# Patient Record
Sex: Male | Born: 1995
Health system: Southern US, Community
[De-identification: ages and names within clinical notes are randomized; demographics above are authoritative.]

## PROBLEM LIST (undated history)

## (undated) DIAGNOSIS — J45909 Unspecified asthma, uncomplicated: Secondary | ICD-10-CM

## (undated) DIAGNOSIS — F840 Autistic disorder: Secondary | ICD-10-CM

---
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
## 2018-02-07 ENCOUNTER — Ambulatory Visit (INDEPENDENT_AMBULATORY_CARE_PROVIDER_SITE_OTHER): Payer: Medicaid Other | Admitting: Family Medicine

## 2018-02-07 ENCOUNTER — Other Ambulatory Visit: Payer: Self-pay

## 2018-02-07 ENCOUNTER — Encounter: Payer: Self-pay | Admitting: Family Medicine

## 2018-02-07 VITALS — BP 110/64 | HR 77 | Temp 99.0°F | Ht 69.5 in | Wt 144.0 lb

## 2018-02-07 DIAGNOSIS — K5900 Constipation, unspecified: Secondary | ICD-10-CM

## 2018-02-07 DIAGNOSIS — Z0000 Encounter for general adult medical examination without abnormal findings: Secondary | ICD-10-CM

## 2018-02-07 DIAGNOSIS — G479 Sleep disorder, unspecified: Secondary | ICD-10-CM

## 2018-02-07 MED ORDER — POLYETHYLENE GLYCOL 3350 17 GM/SCOOP PO POWD: 17.0000 g | Freq: Two times a day (BID) | ORAL | 1 refills | Status: DC | PRN

## 2018-02-07 NOTE — Progress Notes (Signed)
   Subjective:    Patient ID: Matthew Ingram, male    DOB: 10-Aug-1996, 22 y.o.   MRN: [id number]   CC: physical- new patient  HPI: Patient establishing care with new practice. He is 22 yo still in school with some developmental delays wishing to participate in the special olympics. Has no complaints other than some trouble sleeping/  HA and trouble sleeping: - Dad reports that this is usually when he is laying down at night and gets under the covers with his phone - Plays video games 3-4 hours every night after school - leaves TV on in bedroom - Trouble falling asleep, not staying asleep - Headache occurs when stares at games and phone a lot, and resolves when he closes his eyes to go to bed  Constipation: - Has been an ongoing problem, maybe goes once a week, seems to be hard - mom has tried miralax previously with some good results, but not used long term - tries to encourage fruit and veggies into diet - patient is very active   Review of Systems  Per HPI, else denies recent illness, fever, headache, changes in vision, chest pain, shortness of breath, abdominal pain, N/V/D, weakness   Patient Active Problem List   Diagnosis Date Noted  . Constipation 02/08/2018  . Sleep difficulties 02/08/2018     Objective:  BP 110/64   Pulse 77   Temp 99 F (37.2 C) (Oral)   Ht 5' 9.5" (1.765 m)   Wt 144 lb (65.3 kg)   SpO2 99%   BMI 20.96 kg/m  Vitals and nursing note reviewed  General: NAD, pleasant HEENT: Atreumatic. Normocephalic. Normal TMs and ear canals bilaterally, Normal oropharynx without erythema, lesions, exudate. Fissuring noted on tongue. Neck: No cervical lymphadenopathy.  Cardiac: RRR, no m/r/g Respiratory: CTAB, normal work of breathing Abdomen: soft, nontender, nondistended, bowel sounds normal Skin: warm and dry, no rashes noted MSK: BLUE and BLLE with 5/5 strength, normal gait Neuro: alert and oriented, CN II-XII grossly intact Psych: Normal  affect   Assessment & Plan:    Constipation Patient has had trouble with this for many years and miralax worked previously. Given miralax and instructed on tapering this as needed, based on consistency of bowel movements.   Sleep difficulties Likely related to sleep habit. Counseled on no TV on at night in bedroom and to stop watching 30 minutes prior to bed. Counseled on no cell phone use prior to bed or video games, and also instructed on how to put a red filter on the phone. Encouraged outdoor activities. Encouraged sleep routine nightly.    [place] Corie Allis, DO Family Medicine Resident PGY-1

## 2018-02-07 NOTE — Patient Instructions (Signed)
Thank you for coming to see me today. It was a pleasure! Today we talked about:   Constipation and media usage. Please limit cell phone and TV usage prior to bedtime.  Please us miralax daily to ensure soft bowel movements. May use it as needed.  Please follow-up with me in 1 year or as needed.  If you have any questions or concerns, please do not hesitate to call the office at [phone].  Take Care,   Matthew Elany Felix, DO

## 2018-02-08 DIAGNOSIS — G479 Sleep disorder, unspecified: Secondary | ICD-10-CM | POA: Insufficient documentation

## 2018-02-08 DIAGNOSIS — K5900 Constipation, unspecified: Secondary | ICD-10-CM | POA: Insufficient documentation

## 2018-02-08 NOTE — Assessment & Plan Note (Signed)
Likely related to sleep habit. Counseled on no TV on at night in bedroom and to stop watching 30 minutes prior to bed. Counseled on no cell phone use prior to bed or video games, and also instructed on how to put a red filter on the phone. Encouraged outdoor activities. Encouraged sleep routine nightly.

## 2018-02-08 NOTE — Assessment & Plan Note (Signed)
Patient has had trouble with this for many years and miralax worked previously. Given miralax and instructed on tapering this as needed, based on consistency of bowel movements.

## 2018-02-09 ENCOUNTER — Ambulatory Visit: Payer: Self-pay | Admitting: Family Medicine

## 2018-06-10 ENCOUNTER — Encounter (HOSPITAL_COMMUNITY): Payer: Self-pay

## 2018-06-10 ENCOUNTER — Emergency Department (HOSPITAL_COMMUNITY): Payer: Medicaid Other

## 2018-06-10 ENCOUNTER — Other Ambulatory Visit: Payer: Self-pay

## 2018-06-10 ENCOUNTER — Emergency Department (HOSPITAL_COMMUNITY)
Admission: EM | Admit: 2018-06-10 | Discharge: 2018-06-10 | Disposition: A | Discharge status: Home / Self Care | Payer: Medicaid Other | Attending: Emergency Medicine | Admitting: Emergency Medicine

## 2018-06-10 DIAGNOSIS — R0789 Other chest pain: Secondary | ICD-10-CM | POA: Diagnosis not present

## 2018-06-10 DIAGNOSIS — J45909 Unspecified asthma, uncomplicated: Secondary | ICD-10-CM | POA: Diagnosis not present

## 2018-06-10 DIAGNOSIS — F840 Autistic disorder: Secondary | ICD-10-CM | POA: Insufficient documentation

## 2018-06-10 DIAGNOSIS — R079 Chest pain, unspecified: Secondary | ICD-10-CM | POA: Diagnosis present

## 2018-06-10 LAB — BASIC METABOLIC PANEL
Anion gap: 8 (ref 5–15)
BUN: 9 mg/dL (ref 6–20)
CALCIUM: 9.5 mg/dL (ref 8.9–10.3)
CO2: 25 mmol/L (ref 22–32)
Chloride: 103 mmol/L (ref 101–111)
Creatinine, Ser: 1.04 mg/dL (ref 0.61–1.24)
GFR calc Af Amer: >60 mL/min (ref >60)
GLUCOSE: 98 mg/dL (ref 65–99)
Potassium: 4.9 mmol/L (ref 3.5–5.1)
Sodium: 136 mmol/L (ref 135–145)

## 2018-06-10 LAB — I-STAT TROPONIN, ED
Troponin i, poc: 0 ng/mL (ref 0.00–0.08)
Troponin i, poc: 0 ng/mL (ref 0.00–0.08)

## 2018-06-10 LAB — CBC
HCT: 49.2 % (ref 39.0–52.0)
Hemoglobin: 16.1 g/dL (ref 13.0–17.0)
MCH: 29.2 pg (ref 26.0–34.0)
MCHC: 32.7 g/dL (ref 30.0–36.0)
MCV: 89.1 fL (ref 78.0–100.0)
Platelets: 233 10*3/uL (ref 150–400)
RBC: 5.52 MIL/uL (ref 4.22–5.81)
RDW: 12.2 % (ref 11.5–15.5)
WBC: 4.7 10*3/uL (ref 4.0–10.5)

## 2018-06-10 LAB — CBG MONITORING, ED: Glucose-Capillary: 74 mg/dL (ref 65–99)

## 2018-06-10 LAB — C-REACTIVE PROTEIN: CRP: <0.8 mg/dL (ref <1.0)

## 2018-06-10 LAB — D-DIMER, QUANTITATIVE: D-Dimer, Quant: <0.27 ug/mL-FEU (ref 0.00–0.50)

## 2018-06-10 LAB — SEDIMENTATION RATE: Sed Rate: 1 mm/hr (ref 0–16)

## 2018-06-10 MED ORDER — ACETAMINOPHEN 160 MG/5ML PO SOLN: 500.0000 mg | Freq: Once | ORAL | Status: DC

## 2018-06-10 NOTE — ED Triage Notes (Signed)
Patient complains of 1 week of intermittent left anterior cp. Worse with movement and inspiration, denies cough and cold

## 2018-06-10 NOTE — ED Notes (Signed)
EKG done at triage.

## 2018-06-10 NOTE — ED Notes (Signed)
Transported to radiology via stretcher per rad tech  

## 2018-06-10 NOTE — ED Provider Notes (Signed)
[facility] Provider Note   CSN: [id number] Arrival date & time: 06/10/18  0740     History   Chief Complaint No chief complaint on file.   HPI Matthew Ingram is a 22 y.o. male here for evaluation of chest pain.  Onset 1 week ago.  Onset while patient was walking.  Pain is described as "burning", intermittent, located to the left anterior chest, nonradiating.  Pain lasts anywhere between a few minutes to hours.  Worse with sneezing, coughing, laying flat, taking deep breaths and when he sits forward when getting out of bed.  It feels slightly better when sitting up or leaning forward.  Mother thought it was acid reflux and give him mustard which did not help.  Associated with a mild, dry cough for the last 6 days.  Previous history of benign murmur.  Grandfather had heart failure.  Denies cigarette use.  Denies history of DVT/PE.  No recent travel, prolonged immobilization, surgery, estrogen use, malignancy.  No lower extremity edema or calf  pain.   HPI  Past Medical History:  Diagnosis Date  . Asthma   . Autism     Patient Active Problem List   Diagnosis Date Noted  . Constipation 02/08/2018  . Sleep difficulties 02/08/2018    History reviewed. No pertinent surgical history.      Home Medications    Prior to Admission medications   Medication Sig Start Date End Date Taking? Authorizing Provider  polyethylene glycol powder (GLYCOLAX/MIRALAX) powder Take 17 g by mouth 2 (two) times daily as needed. Patient not taking: Reported on 06/10/2018 02/07/18   Shirley, [place], DO    Family History Family History  Problem Relation Age of Onset  . Cancer Neg Hx   . Diabetes Neg Hx   . Heart failure Neg Hx   . Hyperlipidemia Neg Hx     Social History Social History   Tobacco Use  . Smoking status: Never Smoker  . Smokeless tobacco: Never Used  Substance Use Topics  . Alcohol use: Not on file  . Drug use: Not on file      Allergies   Patient has no known allergies.   Review of Systems Review of Systems  Respiratory: Positive for cough.   Cardiovascular: Positive for chest pain.  All other systems reviewed and are negative.    Physical Exam Updated Vital Signs BP 126/85   Pulse 78   Temp 97.8 F (36.6 C) (Oral)   Resp 18   SpO2 99%   Physical Exam  Constitutional: He appears well-developed and well-nourished.  NAD. Non toxic.   HENT:  Head: Normocephalic and atraumatic.  Nose: Nose normal.  Moist mucous membranes. Tonsils and oropharynx normal  Eyes: Conjunctivae, EOM and lids are normal.  Neck: Trachea normal and normal range of motion.  Trachea midline. No cervical adenopathy  Cardiovascular: Normal rate, regular rhythm, S1 normal, S2 normal and normal heart sounds.  Pulses:      Carotid pulses are 2+ on the right side, and 2+ on the left side.      Radial pulses are 2+ on the right side, and 2+ on the left side.       Dorsalis pedis pulses are 2+ on the right side, and 2+ on the left side.  RRR. No LE edema or calf tenderness.   Pulmonary/Chest: Effort normal and breath sounds normal. No respiratory distress. He has no decreased breath sounds. He has no rhonchi. He exhibits tenderness.  Reproducible chest wall tenderness to left mid chest.  Pain with deep inspiration.    Abdominal: Soft. Bowel sounds are normal. There is no tenderness.  No epigastric tenderness. NTND  Neurological: He is alert. GCS eye subscore is 4. GCS verbal subscore is 5. GCS motor subscore is 6.  Skin: Skin is warm and dry. Capillary refill takes less than 2 seconds.  No rash to chest wall  Psychiatric: He has a normal mood and affect. His speech is normal and behavior is normal. Judgment and thought content normal. Cognition and memory are normal.     ED Treatments / Results  Labs (all labs ordered are listed, but only abnormal results are displayed) Labs Reviewed  BASIC METABOLIC PANEL   SEDIMENTATION RATE  C-REACTIVE PROTEIN  CBC  D-DIMER, QUANTITATIVE (NOT AT [facility])  CBG MONITORING, ED  I-STAT TROPONIN, ED  I-STAT TROPONIN, ED  I-STAT TROPONIN, ED    EKG EKG Interpretation  Date/Time:  Friday June 10 2018 07:46:56 EDT Ventricular Rate:  74 PR Interval:  146 QRS Duration: 90 QT Interval:  364 QTC Calculation: 404 R Axis:   83 Text Interpretation:  Normal sinus rhythm with sinus arrhythmia Nonspecific ST abnormality Abnormal ECG No old tracing to compare Confirmed by Shaune Pollack [phone]) on 06/10/2018 8:22:45 AM   Radiology Dg Chest 2 View  Result Date: 06/10/2018 CLINICAL DATA:  Intermittent left chest pain. EXAM: CHEST - 2 VIEW COMPARISON:  No recent prior. FINDINGS: Mediastinum and hilar structures normal. Lungs are clear. No pleural effusion or pneumothorax. No acute bony abnormality. IMPRESSION: No acute cardiopulmonary disease. Electronically Signed   By: Maisie Fus  Register   On: 06/10/2018 08:47    Procedures Procedures (including critical care time)  Medications Ordered in ED Medications  acetaminophen (TYLENOL) solution 500 mg (has no administration in time range)     Initial Impression / Assessment and Plan / ED Course  I have reviewed the triage vital signs and the nursing notes.  Pertinent labs & imaging results that were available during my care of the patient were reviewed by me and considered in my medical decision making (see chart for details).  Clinical Course as of Jun 11 1219  Fri Jun 10, 2018  1036 Reevaluated patient on work-up benign so far.  He is still having intermittent burning chest pain. Will give NSAID. Pending delta trop.    [CG]    Clinical Course User Index [CG] Liberty Handy, PA-C   22 year old male here with chest pain.   symptoms sound atypical, intermittent, nonexertional.  Pertinent cardiac risk factors include none.  On exam he has reproducible chest wall tenderness with palpation and inspiration.  He has  no risk factors for DVT/PE.  Differential diagnosis includes MSK versus pleurisy versus ACS and PE less likely.  Pericarditis considered as well.  Will obtain chest pain work-up and reassess. HEART score <1.   Final Clinical Impressions(s) / ED Diagnoses   Labs, ekg, cxr, trop 0.00 > 0.00.  Pt's pain was constant in ER with normal trop.  Given low heart score, age, atypical nature of CP likely msk. Will dc with NSAID for next 48 hours, f/u with pcp if persistent pain. Return instructions given, mother in agreement.  Final diagnoses:  Atypical chest pain    ED Discharge Orders    None       Jerrell Mylar 06/10/18 1220    Shaune Pollack, MD 06/11/18 1311

## 2018-06-10 NOTE — ED Notes (Signed)
Attempted IV access x2 without success; blood obtained in process with exception of sed rate; ED phleb will obtain sed rate - pt and mother aware of same

## 2018-06-10 NOTE — ED Notes (Signed)
Onset of left-sided cp x2 week ago - interm - no exacerbating factors - denies recreational drug use/abuse; states has been using mustartd water and tums without relief; presently rates pain 1/10 - appears comfortable at this time; skin warm, dry; reports nonprod cough x6 days - denies fevers; no h/o same pain; no known cardiac hx - family h/o heart murmur (mother); ccm showing SR rate 70 without ectopy; mother at bedside VSS

## 2018-06-10 NOTE — Discharge Instructions (Signed)
You were seen in ER for chest pain.   Your lab work, EKG, chest x-ray, heart enzymes were normal.   The cause of chest pain is unknown, although most likely related to muscular cause.  Possibly acid reflux.   Take acetaminophen (tylenol) or ibuprofen (motrin, advil) every 6-8 hours for the next 2 days.  If pain persists, follow up with your primary care doctor for further evaluation.   Return to the ER if you have worsening pain, shortness of breath, difficulty breathing, fevers, wet sounding cough, leg or calf swelling or pain.

## 2018-06-10 NOTE — ED Notes (Signed)
Pt was discharged by provider who states pt had no questions and understood d/c instructions

## 2018-06-10 NOTE — ED Notes (Signed)
Transported from radiology via stretcher per CT

## 2018-06-10 NOTE — ED Provider Notes (Signed)
Previously healthy 22 year old male here with mild, pleuritic, positional chest pain.  History is consistent with possible muscular skeletal pain versus less likely mild pericarditis or pleuritis.  No history of autoimmune disorders.  No recent medication change.  No recent illnesses, though he did have some sneezing several weeks ago which could represent a mild preceding URI.  He has no evidence of cardiomegaly on chest x-ray to suggest effusion or heart failure.  Lab work pending.  Will plan to treat with anti-inflammatories and good return precautions.   Shaune PollackIsaacs, Densel Kronick, MD 06/10/18 [id number]

## 2018-06-21 ENCOUNTER — Other Ambulatory Visit: Payer: Self-pay

## 2018-06-21 ENCOUNTER — Ambulatory Visit (HOSPITAL_COMMUNITY)
Admission: RE | Admit: 2018-06-21 | Discharge: 2018-06-21 | Disposition: A | Discharge status: Home / Self Care | Payer: Medicaid Other | Source: Ambulatory Visit | Attending: Family Medicine | Admitting: Family Medicine

## 2018-06-21 ENCOUNTER — Ambulatory Visit (INDEPENDENT_AMBULATORY_CARE_PROVIDER_SITE_OTHER): Payer: Medicaid Other | Admitting: Family Medicine

## 2018-06-21 VITALS — BP 100/60 | HR 68 | Temp 98.3°F | Ht 69.5 in | Wt 144.0 lb

## 2018-06-21 DIAGNOSIS — R079 Chest pain, unspecified: Secondary | ICD-10-CM | POA: Diagnosis not present

## 2018-06-21 MED ORDER — IBUPROFEN 100 MG PO CHEW: 600.0000 mg | CHEWABLE_TABLET | Freq: Three times a day (TID) | ORAL | 0 refills | Status: DC | PRN

## 2018-06-21 MED ORDER — CAMPHOR-MENTHOL-METHYL SAL 1.2-5.7-6.3 % EX PTCH: MEDICATED_PATCH | CUTANEOUS | 0 refills | Status: DC

## 2018-06-21 NOTE — Progress Notes (Signed)
   Subjective:    Patient ID: Matthew Ingram, male    DOB: [birth year], 22 y.o.   MRN: [id number]   CC: Chest pain  HPI: Patient is a 22 yo male with a past medical history who present today complaining of chest pain. Patient reports that pain has been going on for 2 weeks (6/10 when he graduated high school).  He describes the pain as sharp, mostly localized to the left side of his chest. Patient has had pain early in the morning and when pushing the lawn mower. Patient has  taken ibuprofen once a day with no change. Patient was seen in the ED for this chest pain and all testing was normal. Patient was told that pain was likely MSK in nature. Patient denies any similar presentation in the past. Patient denies any fever, chills, cough, abdominal pain, reflux, nausea, vomiting and diarrhea.  Smoking status reviewed   ROS: all other systems were reviewed and are negative other than in the HPI   Past Medical History:  Diagnosis Date  . Asthma   . Autism     History reviewed. No pertinent surgical history.  Past medical history, surgical, family, and social history reviewed and updated in the EMR as appropriate.  Objective:  BP 100/60   Pulse 68   Temp 98.3 F (36.8 C) (Oral)   Ht 5' 9.5" (1.765 m)   Wt 144 lb (65.3 kg)   BMI 20.96 kg/m   Vitals and nursing note reviewed  General: NAD, pleasant, able to participate in exam Cardiac: RRR, normal heart sounds, no murmurs. 2+ radial and PT pulses bilaterally Respiratory: CTAB, normal effort, No wheezes, rales or rhonchi Abdomen: soft, nontender, nondistended, no hepatic or splenomegaly, +BS Extremities: no edema or cyanosis. WWP. Skin: warm and dry, no rashes noted Neuro: alert and oriented x4, no focal deficits Psych: Normal affect and mood   Assessment & Plan:    Chest pain Patient presents with chest pain for the past 2 weeks. Pain is sharp and localized to his left chest. It is reproducible on exam with palpation  suggestive of MSK pain. EKG today was NSR. Lung exam was unremarkable. Work up in the ED was also unremarkable with normal CXR, EKG, CRP, ESR, CBC and BMP. Patient does not have any risk factor for ACS based on history and age. No pulmonary symptoms suggestive of infection such as pneumonia or pleuritis. No GI symptoms like GERD which could also explain symptoms. Pain likely MSK in nature. --Ibuprofen 600 mg q8 for 3-4 dayswith food --Salonpas patch  --Return to clinic if symptoms do not improve or worsen      Marjie Skiff, MD [place] PGY-2

## 2018-06-21 NOTE — Patient Instructions (Signed)
Chest Wall Pain °Chest wall pain is pain in or around the bones and muscles of your chest. Sometimes, an injury causes this pain. Sometimes, the cause may not be known. This pain may take several weeks or longer to get better. °Follow these instructions at home: °Pay attention to any changes in your symptoms. Take these actions to help with your pain: °· Rest as told by your health care provider. °· Avoid activities that cause pain. These include any activities that use your chest muscles or your abdominal and side muscles to lift heavy items. °· If directed, apply ice to the painful area: °? Put ice in a plastic bag. °? Place a towel between your skin and the bag. °? Leave the ice on for 20 minutes, 2-3 times per day. °· Take over-the-counter and prescription medicines only as told by your health care provider. °· Do not use tobacco products, including cigarettes, chewing tobacco, and e-cigarettes. If you need help quitting, ask your health care provider. °· Keep all follow-up visits as told by your health care provider. This is important. ° °Contact a health care provider if: °· You have a fever. °· Your chest pain becomes worse. °· You have new symptoms. °Get help right away if: °· You have nausea or vomiting. °· You feel sweaty or light-headed. °· You have a cough with phlegm (sputum) or you cough up blood. °· You develop shortness of breath. °This information is not intended to replace advice given to you by your health care provider. Make sure you discuss any questions you have with your health care provider. °Document Released: 12/14/2005 Document Revised: 04/23/2016 Document Reviewed: 03/11/2015 °Elsevier Interactive Patient Education © 2018 [employer] ° °

## 2018-06-21 NOTE — Assessment & Plan Note (Signed)
Patient presents with chest pain for the past 2 weeks. Pain is sharp and localized to his left chest. It is reproducible on exam with palpation suggestive of MSK pain. EKG today was NSR. Lung exam was unremarkable. Work up in the ED was also unremarkable with normal CXR, EKG, CRP, ESR, CBC and BMP. Patient does not have any risk factor for ACS based on history and age. No pulmonary symptoms suggestive of infection such as pneumonia or pleuritis. No GI symptoms like GERD which could also explain symptoms. Pain likely MSK in nature. --Ibuprofen 600 mg q8 for 3-4 dayswith food --Salonpas patch  --Return to clinic if symptoms do not improve or worsen

## 2018-06-28 ENCOUNTER — Ambulatory Visit (INDEPENDENT_AMBULATORY_CARE_PROVIDER_SITE_OTHER): Payer: Medicaid Other

## 2018-06-28 ENCOUNTER — Other Ambulatory Visit: Payer: Self-pay | Admitting: Family Medicine

## 2018-06-28 DIAGNOSIS — Z111 Encounter for screening for respiratory tuberculosis: Secondary | ICD-10-CM

## 2018-06-28 NOTE — Progress Notes (Signed)
   Patient in to nurse clinic for TB testing. Patient workplace requires quantiferon gold testing. Order placed by preceptor and patient taken to lab.   Ples SpecterAlisa Brake, RN [facility] RN)

## 2018-07-02 LAB — QUANTIFERON-TB GOLD PLUS
QUANTIFERON NIL VALUE: 0.02 [IU]/mL
QUANTIFERON TB1 AG VALUE: 0.02 [IU]/mL
QUANTIFERON TB2 AG VALUE: 0.02 [IU]/mL
QUANTIFERON-TB GOLD PLUS: NEGATIVE

## 2018-07-05 ENCOUNTER — Encounter: Payer: Self-pay | Admitting: Family Medicine

## 2018-07-08 ENCOUNTER — Telehealth: Payer: Self-pay

## 2018-07-08 NOTE — Telephone Encounter (Signed)
Attempted to cal back, but no answer. The result is negative, or normal.  Thanks.

## 2018-07-08 NOTE — Telephone Encounter (Signed)
Pts mom left VM on nurse line asking about 7/2 lab result. Please call mother back with results.

## 2018-07-08 NOTE — Telephone Encounter (Signed)
Mother informed of negative results but will need this in writing for project search.  She is out of town right now but will call when she returns so we can place the results up front for her. [place] Souter,CMA

## 2018-11-29 ENCOUNTER — Other Ambulatory Visit: Payer: Self-pay

## 2018-11-29 ENCOUNTER — Ambulatory Visit (INDEPENDENT_AMBULATORY_CARE_PROVIDER_SITE_OTHER): Payer: Medicaid Other | Admitting: Family Medicine

## 2018-11-29 VITALS — BP 114/78 | HR 68 | Temp 98.5°F | Wt 156.0 lb

## 2018-11-29 DIAGNOSIS — K5900 Constipation, unspecified: Secondary | ICD-10-CM | POA: Diagnosis not present

## 2018-11-29 DIAGNOSIS — F840 Autistic disorder: Secondary | ICD-10-CM | POA: Insufficient documentation

## 2018-11-29 MED ORDER — POLYETHYLENE GLYCOL 3350 17 GM/SCOOP PO POWD: 17.0000 g | Freq: Two times a day (BID) | ORAL | 1 refills | Status: DC | PRN

## 2018-11-29 NOTE — Patient Instructions (Signed)
Thank you for coming to see me today. It was a pleasure!    Try Miralax with each meal until her starts having soft bowel movements and then you may decrease the amount. You may try this with juice to help encourage bowel movements.   Please follow-up as needed.  If you have any questions or concerns, please do not hesitate to call the office at [phone].  Take Care,   [place] Ladasha Schnackenberg, DO  Constipation, Adult Constipation is when a person:  Poops (has a bowel movement) fewer times in a week than normal.  Has a hard time pooping.  Has poop that is dry, hard, or bigger than normal.  Follow these instructions at home: Eating and drinking   Eat foods that have a lot of fiber, such as: ? Fresh fruits and vegetables. ? Whole grains. ? Beans.  Eat less of foods that are high in fat, low in fiber, or overly processed, such as: ? JamaicaFrench fries. ? Hamburgers. ? Cookies. ? Candy. ? Soda.  Drink enough fluid to keep your pee (urine) clear or pale yellow. General instructions  Exercise regularly or as told by your doctor.  Go to the restroom when you feel like you need to poop. Do not hold it in.  Take over-the-counter and prescription medicines only as told by your doctor. These include any fiber supplements.  Do pelvic floor retraining exercises, such as: ? Doing deep breathing while relaxing your lower belly (abdomen). ? Relaxing your pelvic floor while pooping.  Watch your condition for any changes.  Keep all follow-up visits as told by your doctor. This is important. Contact a doctor if:  You have pain that gets worse.  You have a fever.  You have not pooped for 4 days.  You throw up (vomit).  You are not hungry.  You lose weight.  You are bleeding from the anus.  You have thin, pencil-like poop (stool). Get help right away if:  You have a fever, and your symptoms suddenly get worse.  You leak poop or have blood in your poop.  Your belly feels  hard or bigger than normal (is bloated).  You have very bad belly pain.  You feel dizzy or you faint. This information is not intended to replace advice given to you by your health care provider. Make sure you discuss any questions you have with your health care provider. Document Released: 06/01/2008 Document Revised: 07/03/2016 Document Reviewed: 06/03/2016 Elsevier Interactive Patient Education  2018 [employer]

## 2018-11-29 NOTE — Assessment & Plan Note (Signed)
Patient with previous history of constipation.  Counseled on eating high-fiber foods as patient currently has diet mainly of meat and potatoes.  Previously has used MiraLAX which worked however mom has not been using MiraLAX and has only been using milk of magnesia.  Given a prescription for MiraLAX and instructed to taper this as needed based on the consistency of patient's bowel movements. Given handout.

## 2018-11-29 NOTE — Progress Notes (Signed)
  Subjective:    Patient ID: Matthew Ingram, male    DOB: 05/12/1996, 22 y.o.   MRN: [id number]   CC: constipation  HPI: Abdominal pain and constipation Patient reports that his abdominal pain started Saturday in his lower abdomen.  Reports that he had a bowel movement that was very hard and he had to strain with on Monday after an enema and a laxative.  Reports that he thinks that his abdominal pain is due to his constipation.  Laxative that mom uses milk of magnesia.  Patient denies any nausea, vomiting, blood in stool and denies any diarrhea.  Describes the pain as a stabbing in the lower part of his stomach.  Reports that it has improved since his bowel movement on Monday.  Denies any fevers or chills  Smoking status reviewed  ROS: 10 point ROS is otherwise negative, except as mentioned in HPI  Patient Active Problem List   Diagnosis Date Noted  . Autism   . Constipation 02/08/2018  . Sleep difficulties 02/08/2018     Objective:  BP 114/78   Pulse 68   Temp 98.5 F (36.9 C) (Oral)   Wt 156 lb (70.8 kg)   SpO2 96%   BMI 22.71 kg/m  Vitals and nursing note reviewed  General: NAD, pleasant Respiratory: normal effort Abdomen: soft, nontender, nondistended, normoactive bowel sounds x4  Extremities: no edema or cyanosis. WWP. Skin: warm and dry, no rashes noted Neuro: alert and oriented, no focal deficits Psych: normal affect  Assessment & Plan:    Constipation Patient with previous history of constipation.  Counseled on eating high-fiber foods as patient currently has diet mainly of meat and potatoes.  Previously has used MiraLAX which worked however mom has not been using MiraLAX and has only been using milk of magnesia.  Given a prescription for MiraLAX and instructed to taper this as needed based on the consistency of patient's bowel movements. Given handout.     Matthew Katrell Milhorn, DO Family Medicine Resident PGY-2

## 2019-11-30 IMAGING — CR DG CHEST 2V
2 series · 2 of 2 positions shown · non-contrast
Comparison: No recent prior.

CLINICAL DATA: Intermittent left chest pain.

EXAM:
CHEST - 2 VIEW

[chest pa]
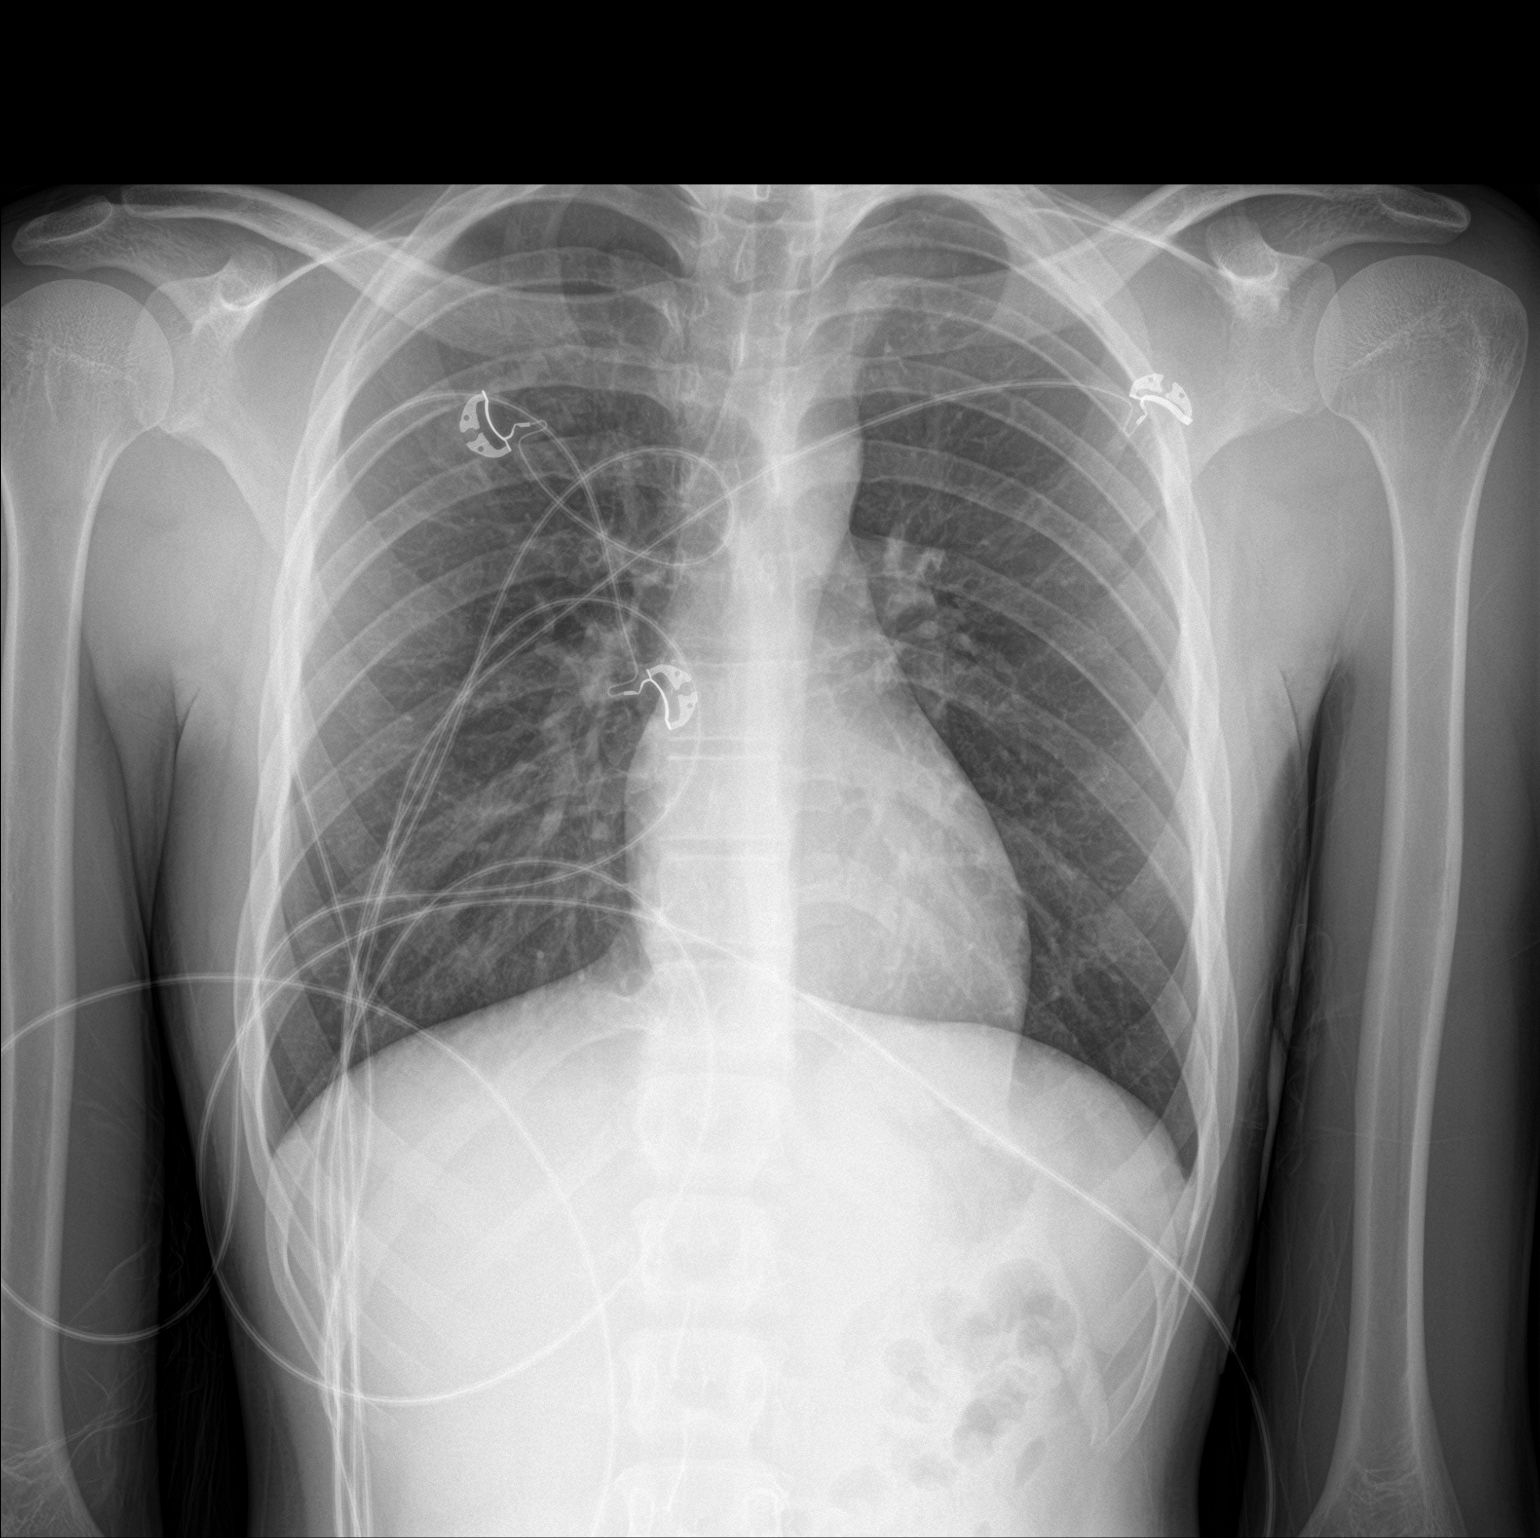

[chest lat]
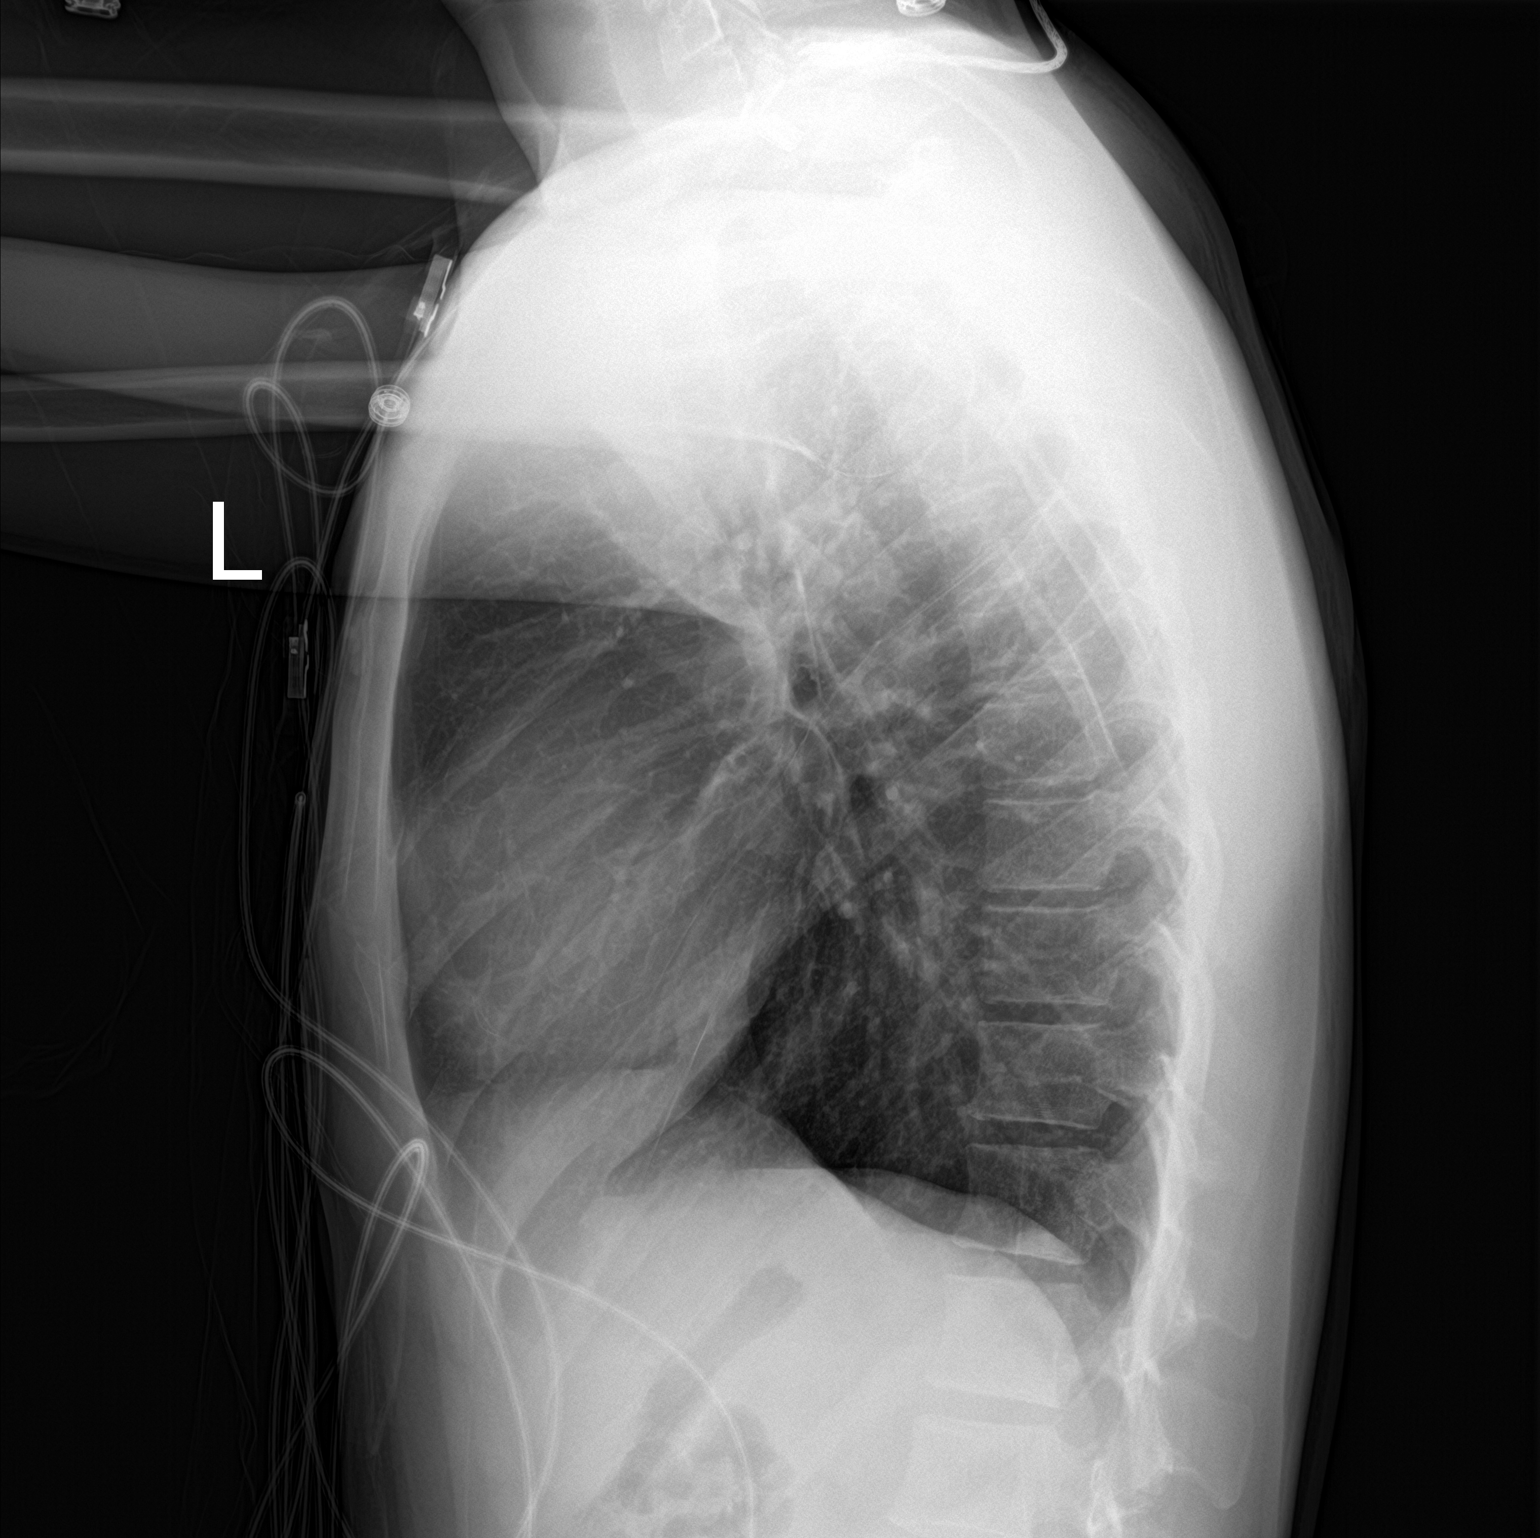

[2 of 2 positions shown; findings below may reference images not displayed]

FINDINGS: Mediastinum and hilar structures normal. Lungs are clear. No pleural
effusion or pneumothorax. No acute bony abnormality.
IMPRESSION: No acute cardiopulmonary disease.

## 2020-02-01 ENCOUNTER — Other Ambulatory Visit: Payer: Self-pay

## 2020-02-01 ENCOUNTER — Ambulatory Visit (INDEPENDENT_AMBULATORY_CARE_PROVIDER_SITE_OTHER): Payer: Medicare Other | Admitting: Internal Medicine

## 2020-02-01 ENCOUNTER — Encounter: Payer: Self-pay | Admitting: Internal Medicine

## 2020-02-01 ENCOUNTER — Telehealth: Payer: Self-pay | Admitting: Radiology

## 2020-02-01 VITALS — BP 118/78 | HR 83 | Ht 69.5 in | Wt 159.2 lb

## 2020-02-01 DIAGNOSIS — R079 Chest pain, unspecified: Secondary | ICD-10-CM | POA: Diagnosis not present

## 2020-02-01 DIAGNOSIS — Z8241 Family history of sudden cardiac death: Secondary | ICD-10-CM | POA: Insufficient documentation

## 2020-02-01 DIAGNOSIS — R002 Palpitations: Secondary | ICD-10-CM | POA: Diagnosis not present

## 2020-02-01 NOTE — Telephone Encounter (Signed)
Enrolled patient for a 14 day Zio monitor to be mailed to patients home.  

## 2020-02-01 NOTE — Patient Instructions (Addendum)
Medication Instructions:  Your physician recommends that you continue on your current medications as directed. Please refer to the Current Medication list given to you today.  Discontinue/reduce your caffeine intake  Labwork: None ordered.  Testing/Procedures: Your physician has requested that you have an echocardiogram. Echocardiography is a painless test that uses sound waves to create images of your heart. It provides your doctor with information about the size and shape of your heart and how well your heart's chambers and valves are working. This procedure takes approximately one hour. There are no restrictions for this procedure.  Please schedule for ECHO  Your physician has recommended that you wear a holter monitor. Holter monitors are medical devices that record the heart's electrical activity. Doctors most often use these monitors to diagnose arrhythmias. Arrhythmias are problems with the speed or rhythm of the heartbeat. The monitor is a small, portable device. You can wear one while you do your normal daily activities. This is usually used to diagnose what is causing palpitations/syncope (passing out).  Follow-Up: Your physician wants you to follow-up in: based on results of tests.   Any Other Special Instructions Will Be Listed Below (If Applicable).  If you need a refill on your cardiac medications before your next appointment, please call your pharmacy.   ZIO XT- Long Term Monitor Instructions   Your physician has requested you wear your ZIO patch monitor__14____days.   This is a single patch monitor.  Irhythm supplies one patch monitor per enrollment.  Additional stickers are not available.   Please do not apply patch if you will be having a Nuclear Stress Test, Echocardiogram, Cardiac CT, MRI, or Chest Xray during the time frame you would be wearing the monitor. The patch cannot be worn during these tests.  You cannot remove and re-apply the ZIO XT patch monitor.   Your  ZIO patch monitor will be sent USPS Priority mail from [facility] directly to your home address. The monitor may also be mailed to a PO BOX if home delivery is not available.   It may take 3-5 days to receive your monitor after you have been enrolled.   Once you have received you monitor, please review enclosed instructions.  Your monitor has already been registered assigning a specific monitor serial # to you.   Applying the monitor   Shave hair from upper left chest.   Hold abrader disc by orange tab.  Rub abrader in 40 strokes over left upper chest as indicated in your monitor instructions.   Clean area with 4 enclosed alcohol pads .  Use all pads to assure are is cleaned thoroughly.  Let dry.   Apply patch as indicated in monitor instructions.  Patch will be place under collarbone on left side of chest with arrow pointing upward.   Rub patch adhesive wings for 2 minutes.Remove white label marked "1".  Remove white label marked "2".  Rub patch adhesive wings for 2 additional minutes.   While looking in a mirror, press and release button in center of patch.  A small green light will flash 3-4 times .  This will be your only indicator the monitor has been turned on.     Do not shower for the first 24 hours.  You may shower after the first 24 hours.   Press button if you feel a symptom. You will hear a small click.  Record Date, Time and Symptom in the Patient Log Book.   When you are ready to remove patch,  follow instructions on last 2 pages of Patient Log Book.  Stick patch monitor onto last page of Patient Log Book.   Place Patient Log Book in [place] box.  Use locking tab on box and tape box closed securely.  The Orange and [employer] has [employer] on it.  Please place in mailbox as soon as possible.  Your physician should have your test results approximately 7 days after the monitor has been mailed back to [facility].   Call [facility] Customer Care at [phone]  if you have questions regarding your ZIO XT patch monitor.  Call them immediately if you see an orange light blinking on your monitor.   If your monitor falls off in less than 4 days contact our Monitor department at [phone].  If your monitor becomes loose or falls off after 4 days call [employer] at [phone] for suggestions on securing your monitor.

## 2020-02-01 NOTE — Progress Notes (Signed)
HPI Matthew Ingram is referred today by Dr. Talbert Forest for evaluation of palpitations and risk assessment for sudden death. His mother is a patient of mine who has malignant ventricular arrhythmias due to phase 2 reentry. Matthew Ingram has not had syncope but does experience palpitations. He makes me a difficult historian. He is fairly vague about his symptoms. He does not have chest pain. He has not worn a cardiac monitor. He has some sob. He has a diagnosis of autism. No Known Allergies   Current Outpatient Medications  Medication Sig Dispense Refill  . Camphor-Menthol-Methyl Sal 1.2-5.7-6.3 % PTCH Apply to affected areas 10 patch 0  . ibuprofen (IBUPROFEN JUNIOR STRENGTH) 100 MG chewable tablet Chew 6 tablets (600 mg total) by mouth every 8 (eight) hours as needed for moderate pain. 30 tablet 0  . polyethylene glycol powder (GLYCOLAX/MIRALAX) powder Take 17 g by mouth 2 (two) times daily as needed. 3350 g 1   No current facility-administered medications for this visit.     Past Medical History:  Diagnosis Date  . Asthma   . Autism     ROS:   All systems reviewed and negative except as noted in the HPI.   History reviewed. No pertinent surgical history.   Family History  Problem Relation Age of Onset  . Cancer Neg Hx   . Diabetes Neg Hx   . Heart failure Neg Hx   . Hyperlipidemia Neg Hx      Social History   Socioeconomic History  . Marital status: Single    Spouse name: Not on file  . Number of children: Not on file  . Years of education: Not on file  . Highest education level: Not on file  Occupational History  . Not on file  Tobacco Use  . Smoking status: Never Smoker  . Smokeless tobacco: Never Used  Substance and Sexual Activity  . Alcohol use: Not on file  . Drug use: Not on file  . Sexual activity: Not on file  Other Topics Concern  . Not on file  Social History Narrative  . Not on file   Social Determinants of Health   Financial Resource Strain:    . Difficulty of Paying Living Expenses: Not on file  Food Insecurity:   . Worried About Programme researcher, broadcasting/film/video in the Last Year: Not on file  . Ran Out of Food in the Last Year: Not on file  Transportation Needs:   . Lack of Transportation (Medical): Not on file  . Lack of Transportation (Non-Medical): Not on file  Physical Activity:   . Days of Exercise per Week: Not on file  . Minutes of Exercise per Session: Not on file  Stress:   . Feeling of Stress : Not on file  Social Connections:   . Frequency of Communication with Friends and Family: Not on file  . Frequency of Social Gatherings with Friends and Family: Not on file  . Attends Religious Services: Not on file  . Active Member of Clubs or Organizations: Not on file  . Attends Banker Meetings: Not on file  . Marital Status: Not on file  Intimate Partner Violence:   . Fear of Current or Ex-Partner: Not on file  . Emotionally Abused: Not on file  . Physically Abused: Not on file  . Sexually Abused: Not on file     BP 118/78   Pulse 83   Ht 5' 9.5" (1.765 m)   Wt 159  lb 3.2 oz (72.2 kg)   SpO2 92%   BMI 23.17 kg/m   Physical Exam:  Well appearing NAD HEENT: Unremarkable Neck:  No JVD, no thyromegally Lymphatics:  No adenopathy Back:  No CVA tenderness Lungs:  Clear HEART:  Regular rate rhythm, 1/6 systolic murmur LLSB, no rubs, no clicks Abd:  soft, positive bowel sounds, no organomegally, no rebound, no guarding Ext:  2 plus pulses, no edema, no cyanosis, no clubbing Skin:  No rashes no nodules Neuro:  CN II through XII intact, motor grossly intact  EKG - nsr with sinus arrhythmia  DEVICE  Normal device function.  See PaceArt for details.   Assess/Plan: 1. Palpitations - these are poorly characterized. I have asked him to obtain a 2 week cardiac monitor. 2. Sob - he will obtain a 2D echo. He has a soft murmur of uncertain significance on exam.  Mikle Bosworth.D.

## 2020-02-06 ENCOUNTER — Ambulatory Visit (INDEPENDENT_AMBULATORY_CARE_PROVIDER_SITE_OTHER): Payer: Medicare Other

## 2020-02-06 DIAGNOSIS — R002 Palpitations: Secondary | ICD-10-CM | POA: Diagnosis not present

## 2020-02-15 ENCOUNTER — Other Ambulatory Visit (HOSPITAL_COMMUNITY): Payer: Medicare Other

## 2020-03-01 ENCOUNTER — Other Ambulatory Visit: Payer: Self-pay

## 2020-03-01 ENCOUNTER — Ambulatory Visit (HOSPITAL_COMMUNITY): Payer: Medicare Other | Attending: Internal Medicine

## 2020-03-01 DIAGNOSIS — R079 Chest pain, unspecified: Secondary | ICD-10-CM | POA: Diagnosis present

## 2020-03-01 DIAGNOSIS — I371 Nonrheumatic pulmonary valve insufficiency: Secondary | ICD-10-CM | POA: Insufficient documentation

## 2020-03-01 DIAGNOSIS — R002 Palpitations: Secondary | ICD-10-CM

## 2020-06-24 ENCOUNTER — Other Ambulatory Visit: Payer: Self-pay | Admitting: Family Medicine

## 2020-06-24 MED ORDER — CETIRIZINE HCL 1 MG/ML PO SOLN: 5.0000 mg | Freq: Every day | ORAL | 11 refills | Status: DC

## 2021-01-21 ENCOUNTER — Other Ambulatory Visit: Payer: Self-pay

## 2021-01-21 ENCOUNTER — Ambulatory Visit (INDEPENDENT_AMBULATORY_CARE_PROVIDER_SITE_OTHER): Payer: Medicare Other | Admitting: Family Medicine

## 2021-01-21 VITALS — BP 98/60 | HR 74

## 2021-01-21 DIAGNOSIS — J069 Acute upper respiratory infection, unspecified: Secondary | ICD-10-CM | POA: Insufficient documentation

## 2021-01-21 DIAGNOSIS — J029 Acute pharyngitis, unspecified: Secondary | ICD-10-CM | POA: Diagnosis not present

## 2021-01-21 LAB — POCT RAPID STREP A (OFFICE): Rapid Strep A Screen: NEGATIVE

## 2021-01-21 NOTE — Patient Instructions (Signed)
I think that you most likely have a viral infection. I think it is very possible you have a Covid infection. We did a strep swab and a Covid swab today in clinic. I will let you know the results of those in the next 2-3 days.  In the meantime, I recommend you take Tylenol for fever and discomfort. You can take up to 4000 mg daily. Also focus on adequate fluid intake.

## 2021-01-21 NOTE — Progress Notes (Signed)
    SUBJECTIVE:   CHIEF COMPLAINT / HPI:   Viral respiratory infection [place] has been feeling unwell for roughly the past week.  He has been having a sore throat, cough and nasal congestion.  He did have a fever up to 102 which improved with Tylenol.  He also notes some muscle aches and pains.  He does not feel like his symptoms have yet started to improve.  His mom notes that he has had multiple episodes of strep throat previously and was suspicious that this might be another episode so brought him in for further testing.  He has not been vaccinated against Covid.  PERTINENT  PMH / PSH: Noncontributory  OBJECTIVE:   BP 98/60   Pulse 74   SpO2 95%   General: Alert and cooperative and appears to be in no acute distress HEENT: Oropharynx normal, no evidence of tonsillar enlargement or tonsillar exudate.  No cervical lymphadenopathy.  TMs visualized bilaterally and normal appearing. Cardio: Normal S1 and S2, no S3 or S4. Rhythm is regular. No murmurs or rubs.   Pulm: Clear to auscultation bilaterally, no crackles, wheezing, or diminished breath sounds. Normal respiratory effort Abdomen: Bowel sounds normal. Abdomen soft and non-tender.  Extremities: No peripheral edema. Warm/ well perfused.  Strong radial pulses. Neuro: Cranial nerves grossly intact  ASSESSMENT/PLAN:   Viral upper respiratory tract infection with cough Based on the presence of the cough and lack of cervical lymphadenopathy, streptococcal pharyngitis was unlikely although rapid test was performed based on parental request.  Rapid strep was negative.  I have a low suspicion for strep throat and will not send a culture.  He and his mother were informed that this sounds more like a viral respiratory infection more similar to coronavirus or influenza.  Were not currently testing for influenza in our clinic.  We will send out a Covid swab.  He does not have any risk factors for severe disease.     Mirian Mo, MD [facility]  [facility]

## 2021-01-21 NOTE — Assessment & Plan Note (Signed)
Based on the presence of the cough and lack of cervical lymphadenopathy, streptococcal pharyngitis was unlikely although rapid test was performed based on parental request.  Rapid strep was negative.  I have a low suspicion for strep throat and will not send a culture.  He and his mother were informed that this sounds more like a viral respiratory infection more similar to coronavirus or influenza.  Were not currently testing for influenza in our clinic.  We will send out a Covid swab.  He does not have any risk factors for severe disease.

## 2021-01-23 LAB — SARS-COV-2, NAA 2 DAY TAT

## 2021-01-23 LAB — NOVEL CORONAVIRUS, NAA: SARS-CoV-2, NAA: NOT DETECTED

## 2021-08-14 ENCOUNTER — Encounter: Payer: Self-pay | Admitting: Student

## 2021-08-14 ENCOUNTER — Other Ambulatory Visit: Payer: Self-pay

## 2021-08-14 ENCOUNTER — Ambulatory Visit (INDEPENDENT_AMBULATORY_CARE_PROVIDER_SITE_OTHER): Payer: Medicare HMO | Admitting: Student

## 2021-08-14 VITALS — BP 133/86 | HR 89 | Wt 207.8 lb

## 2021-08-14 DIAGNOSIS — W57XXXA Bitten or stung by nonvenomous insect and other nonvenomous arthropods, initial encounter: Secondary | ICD-10-CM

## 2021-08-14 DIAGNOSIS — Z9189 Other specified personal risk factors, not elsewhere classified: Secondary | ICD-10-CM | POA: Diagnosis not present

## 2021-08-14 MED ORDER — DIPHENHYDRAMINE-ZINC ACETATE 2-0.1 % EX CREA: 1.0000 "application " | TOPICAL_CREAM | Freq: Three times a day (TID) | CUTANEOUS | 0 refills | Status: DC | PRN
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
## 2021-08-14 NOTE — Patient Instructions (Addendum)
Mr. Montray,  It was wonderful to see you today. Thank you for allowing me to be a part of your care. Below is a short summary of what we discussed at your visit today:  We discussed your feeling of frustration and thoughts about self harm triggered by people stressing you out.  Therapy has helped in the past and unfortunately you've been having difficulty scheduling which her previous therapist.  I have provided you with a list of therapist below that says patients who do not have insurance.  We will schedule another appointment to address your nausea and vomiting   If you have any questions or concerns, please do not hesitate to contact us via phone or MyChart message.   Jerre Simon, MD [facility]     If you are feeling suicidal or depression symptoms worsen please immediately go to:   If you are thinking about harming yourself or having thoughts of suicide, or if you know someone who is, seek help right away. If you are in crisis, make sure you are not left alone.  If someone else is in crisis, make sure he/she/they is not left alone  Call 988 OR 1-800-273-TALK  24 Hour Availability for Walk-IN services  [facility]  [address] [place], [place] [facility] [place] [phone] Crisis [phone]    Other crisis resources:  Family Service of the [employer] (Domestic Violence, Rape & Victim Assistance [phone]  [facility] [employer] [facility]    (ONLY from 8am-4pm)    4127120104  [facility] Mobile Crisis Unit (24/7)   [phone]  [place] National Suicide Hotline   [phone] Len Childs)       Outpatient Mental Health Providers (No Insurance required or Self Pay)  [facility]  [phone] [email or web address] Marcy Panning and [facility]  [address] [place], [place]  [phone] Crisis [phone]  [facility]) can see uninsured folks for outpatient therapy [email or web address] [address] [place], [place] [address] [phone]  [facility]    Walk-in Mon-Fri, 8am-3pm [email or web address] Scull [address] [place], [place]  [phone]   [address]  [place] [phone] [facility] [place] [facility] for psych med management, there may be a wait- if [facility] is working with clients for OPT, they will coordinate with [facility] for psych  [facility]   Walk-in-Clinic: Monday- Friday 9:00 AM - 4:00 PM [address]   [place], [place] [phone]  Family Services of the [place] [employer]) walk in M-F 8am-12pm and  1pm-3pm [place]- [address]     [phone]  [employer] -[address]  Phone: [phone]  [employer] (Mental Health and substance challenges) [address]   [place] [phone]    [email or web address]    [address]  [employer] [facility] the [place]  [facility] -[address], [place]     Phone:  [phone] [place]-  [address]  [phone]   [facility]  [address] [place]  [phone] [email or web address]   [facility] ( virtual or zoom therapy) [email or web address]  [address] [place]  [phone]    [facility] [phone]  grief counseling, dementia and caregiver support    [facility] Walk-in MWF 12:30 to 3:00     [address] [place]  [place] [address]  [phone]  [email or web address] call to schedule an appointment     National Alliance on Mental Illness (NAMI) [place]- Wellness classes, Support groups        [address] [place], [place] [address] [phone]   [email or web address]   [facility]  (Psycho-social Rehabilitation clubhouse, Individual and group therapy) [address] [place], [place] [address]   336- [phone]  24- Hour Availability:  [facility] [phone] or [phone] * Family Service of the [employer] (Domestic Violence, Rape, etc. )([phone] Vesta Mixer [phone] or 5070935831 * [facility] [place] [facility] [phone] only) [phone] (after hours) *[facility] Crisis Unit [phone] *[place] National Suicide Hotline [phone] Len Childs)

## 2021-09-17 ENCOUNTER — Ambulatory Visit: Payer: Medicare HMO | Admitting: Family Medicine

## 2021-09-17 NOTE — Progress Notes (Deleted)
   SUBJECTIVE:   CHIEF COMPLAINT / HPI:   No chief complaint on file.    Matthew Ingram is a 24 y.o. male here for ***   Pt reports ***    PERTINENT  PMH / PSH: reviewed and updated as appropriate   OBJECTIVE:   There were no vitals taken for this visit.  ***  ASSESSMENT/PLAN:   No problem-specific Assessment & Plan notes found for this encounter.     Katha Cabal, DO PGY-3, [facility] 09/17/2021      {    This will disappear when note is signed, click to select method of visit    :1}

## 2022-03-11 DIAGNOSIS — H52203 Unspecified astigmatism, bilateral: Secondary | ICD-10-CM | POA: Diagnosis not present

## 2022-03-11 DIAGNOSIS — H5213 Myopia, bilateral: Secondary | ICD-10-CM | POA: Diagnosis not present

## 2022-03-11 DIAGNOSIS — Z0100 Encounter for examination of eyes and vision without abnormal findings: Secondary | ICD-10-CM | POA: Diagnosis not present

## 2022-12-15 ENCOUNTER — Encounter: Payer: Medicare HMO | Admitting: Student

## 2022-12-30 ENCOUNTER — Ambulatory Visit (INDEPENDENT_AMBULATORY_CARE_PROVIDER_SITE_OTHER): Payer: Medicare HMO | Admitting: Student

## 2022-12-30 ENCOUNTER — Encounter: Payer: Self-pay | Admitting: Student

## 2022-12-30 VITALS — BP 125/72 | HR 97 | Ht 69.5 in | Wt 213.6 lb

## 2022-12-30 DIAGNOSIS — R69 Illness, unspecified: Secondary | ICD-10-CM | POA: Diagnosis not present

## 2022-12-30 DIAGNOSIS — Z13220 Encounter for screening for lipoid disorders: Secondary | ICD-10-CM | POA: Diagnosis not present

## 2022-12-30 DIAGNOSIS — M7989 Other specified soft tissue disorders: Secondary | ICD-10-CM | POA: Insufficient documentation

## 2022-12-30 DIAGNOSIS — R4589 Other symptoms and signs involving emotional state: Secondary | ICD-10-CM

## 2022-12-30 NOTE — Assessment & Plan Note (Signed)
XR right hand and forearm for evaluation given length of time swelling has occurred.  Patient is not at increased risk for any type of DVT.  Currently, hemodynamically stable on room air and without any swelling of the right hand or upper extremity.  After evaluation of x-rays for concern of trauma, instructed to continue with monitoring.  If he notices the swelling again, he is to take a photo of this so I can see it.  Strict return precautions were given.

## 2022-12-30 NOTE — Assessment & Plan Note (Signed)
Decided to continue with therapy as a starting point  Therapist options based on patients insurance provided  Suicide Hotline provided  Patient without SI/HI; denies active plan and reports that they are safe to continue with outpatient treatment.  Close follow up 2 months

## 2022-12-30 NOTE — Patient Instructions (Addendum)
It was great to see you today! Thank you for choosing [facility] for your primary care. Matthew Ingram was seen for physical exam.  Today we addressed:  Therapy and Counseling Resources Most providers on this list will take Medicaid. Patients with commercial insurance or Medicare should contact their insurance company to get a list of in network providers.  [facility] (spanish speaking therapist available)(habla espanol)(take medicare and medicaid)  [place], [place], [place] [address], [place] [email or web address] [phone]  [facility]iatry and Counseling [address] [place], [place] [address] [place], [place], [place] [address]      [phone]  [facility] (spanish available) [place], [place] [address] [place] (take [facility] and medicare) [address], [place], [place] [address]       [phone]     [place] (virtual only) [phone]  [facility]inny Bl[facility]om [facility] [address] [place], [place]  Family [facility]:  [place]. [place] [phone]  [facility]:  [place] [address] [place] [phone]  [facility] (under & uninsured) [address] [place] [phone]    [email or web address]    [place] [address]  [place]    [phone]  [facility] [place]     Phone:  [phone]     [place]  [place] [address]      [place], [place] ext [place]: [place], [place], [place]   [place] ([place] therapist) [email or web address]  [place] [address]    [place] [address]    [phone]    [facility]   [address],  [place], [place]   [place]  [place]  [facility]  [address] [place], [place]        [phone]  Open Access/Walk In Clinic under & uninsured  [facility]  [address] [place], [place] Crisis [phone]  [facility] [place],  ([place])   [place], [place]: [phone]) 8:30 - 12; 1 - 2:30  [facility] [place],  [place], [place]    ([phone]):8:30 - 12; 2 - 3PM  [facility] [employer],  [address],  [place]; [phone]):   Mon - Fri 8 AM - 5 PM  [facility] [place]  MWF 12:30 to 3:00 or call to schedule an appointment  [phone]  Specific Provider options Psychology Today  [email or web address] click on find a therapist  enter your zip code left side and select or tailor a therapist for your specific need.   [place] Provider Directory [email or web address]  (Medicaid)   Follow all drop down to find a provider  [place] or [email or web address] [address], [place], [place] Recovery support and educational   24- Hour Availability:   [facility]  [address] [place], [place] Crisis [place]  Crisis Line [phone]  [facility] Crisis Service  [place]  [phone] (after hours)  Therapeutic Alternative/Mobile Crisis   [phone]  [place] National Suicide Hotline  [phone] Diamantina Monks)  Call 911 or go to emergency room  [facility]  [phone]);  [place] and [employer]   [phone]); [place], [place], [place], [place], [place], [place], [place]  -We will check your cholesterol levels today  -I have placed a referral for a hand Xray  -See above for therapist options  -Please return if swelling persists and take a photo when it happens again -MiraLAX refill sent   If you haven't already, sign up for My Chart to have easy access to your labs results, and communication with your primary care physician.  I recommend that you always bring your medications to each appointment as this makes it easy to ensure you are on the correct medications and helps [place] not miss refills when you need them. Call the clinic at [phone] if your symptoms worsen or you have any concerns.  You should return to our clinic Return in about 2 months (around 02/28/2023) for mood, right hand swelling . Please arrive 15 minutes before your appointment to ensure smooth check in process.  We appreciate your efforts in making this happen.  Thank you for allowing me to participate in your care, Erskine Emery, MD 12/30/2022, 11:43 AM PGY-2, [place]

## 2022-12-30 NOTE — Assessment & Plan Note (Signed)
Cholesterol panel ordered.

## 2022-12-30 NOTE — Progress Notes (Signed)
    SUBJECTIVE:   Chief compliant/HPI: annual examination  Matthew Ingram is a 27 y.o. who presents today for an annual exam.   Reviewed and updated history.   Review of systems form notable for hand swelling.   Right hand swelling over the last 2 months  Pain associated but no significant presence of pain  No trauma to cause the onset, but difficult to assess given patient's history of autism Per patient, he has injured the hand in the past but cannot describe how or when.  He does have a punching bag at home.  No pain with movement of fingers or wrist  Per grandmother, the patient has had 3 episodes of swelling solely in the right hand.   Patient has a temper and has mood swings. He used to see a psychiatrist but no longer does. Would like to speak to a therapist, denies SI/HI.  OBJECTIVE:   BP 125/72   Pulse 97   Ht 5' 9.5" (1.765 m)   Wt 213 lb 9.6 oz (96.9 kg)   SpO2 99%   BMI 31.09 kg/m   General: Alert and oriented in no apparent distress Heart: Regular rate and rhythm with no murmurs appreciated Lungs: CTA bilaterally, no wheezing Abdomen: Bowel sounds present, no abdominal pain Skin: Warm and dry Extremities: No lower extremity edema, FROM of wrist and fingers, no TTP of joints or deformities or step offs noted. NML ROM of elbow. Neurovascularly intact    ASSESSMENT/PLAN:   Swelling of right hand XR right hand and forearm for evaluation given length of time swelling has occurred.  Patient is not at increased risk for any type of DVT.  Currently, hemodynamically stable on room air and without any swelling of the right hand or upper extremity.  After evaluation of x-rays for concern of trauma, instructed to continue with monitoring.  If he notices the swelling again, he is to take a photo of this so I can see it.  Strict return precautions were given.   Screening for hyperlipidemia Cholesterol panel ordered   Depressed mood Decided to continue with therapy as  a starting point  Therapist options based on patients insurance provided  Suicide Hotline provided  Patient without SI/HI; denies active plan and reports that they are safe to continue with outpatient treatment.  Close follow up 2 months     Annual Examination  See AVS for age appropriate recommendations  PHQ, reviewed and discussed.     12/30/2022   11:51 AM 08/14/2021   11:28 AM 11/29/2018   11:38 AM  PHQ9 SCORE ONLY  PHQ-9 Total Score 10 7 0   Blood pressure reviewed and at goal.    Considered the following items based upon USPSTF recommendations: HIV testing: not indicated Hepatitis C: not indicated Hepatitis B: not indicated Syphilis if at high risk: {not indicated GC/CTnot indicated Lipid panel (nonfasting or fasting) discussed based upon AHA recommendations and ordered.  Consider repeat every 4-6 years.  Reviewed risk factors for latent tuberculosis and not indicated   Not sexually active   Follow up in 1 year or sooner if indicated.    Erskine Emery, MD [place]

## 2022-12-31 LAB — LIPID PANEL
Chol/HDL Ratio: 4.7 ratio (ref 0.0–5.0)
Cholesterol, Total: 193 mg/dL (ref 100–199)
HDL: 41 mg/dL (ref >39)
LDL Chol Calc (NIH): 128 mg/dL — ABNORMAL HIGH (ref 0–99)
Triglycerides: 131 mg/dL (ref 0–149)
VLDL Cholesterol Cal: 24 mg/dL (ref 5–40)

## 2023-01-01 ENCOUNTER — Encounter: Payer: Self-pay | Admitting: Student

## 2023-03-22 ENCOUNTER — Ambulatory Visit: Payer: Self-pay | Admitting: Student
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
## 2023-06-16 DIAGNOSIS — H52223 Regular astigmatism, bilateral: Secondary | ICD-10-CM | POA: Diagnosis not present

## 2023-06-16 DIAGNOSIS — H5213 Myopia, bilateral: Secondary | ICD-10-CM | POA: Diagnosis not present

## 2023-06-16 DIAGNOSIS — H52203 Unspecified astigmatism, bilateral: Secondary | ICD-10-CM | POA: Diagnosis not present

## 2023-07-15 DIAGNOSIS — F840 Autistic disorder: Secondary | ICD-10-CM | POA: Diagnosis not present

## 2023-07-15 DIAGNOSIS — F0634 Mood disorder due to known physiological condition with mixed features: Secondary | ICD-10-CM | POA: Diagnosis not present

## 2023-08-19 DIAGNOSIS — F0634 Mood disorder due to known physiological condition with mixed features: Secondary | ICD-10-CM | POA: Diagnosis not present

## 2023-08-19 DIAGNOSIS — F840 Autistic disorder: Secondary | ICD-10-CM | POA: Diagnosis not present

## 2023-10-05 DIAGNOSIS — F89 Unspecified disorder of psychological development: Secondary | ICD-10-CM | POA: Diagnosis not present

## 2023-11-09 DIAGNOSIS — F0634 Mood disorder due to known physiological condition with mixed features: Secondary | ICD-10-CM | POA: Diagnosis not present

## 2023-11-09 DIAGNOSIS — F840 Autistic disorder: Secondary | ICD-10-CM | POA: Diagnosis not present

## 2023-12-17 DIAGNOSIS — F840 Autistic disorder: Secondary | ICD-10-CM | POA: Diagnosis not present

## 2023-12-17 DIAGNOSIS — F0634 Mood disorder due to known physiological condition with mixed features: Secondary | ICD-10-CM | POA: Diagnosis not present

## 2023-12-24 DIAGNOSIS — F89 Unspecified disorder of psychological development: Secondary | ICD-10-CM | POA: Diagnosis not present

## 2023-12-27 DIAGNOSIS — F89 Unspecified disorder of psychological development: Secondary | ICD-10-CM | POA: Diagnosis not present

## 2024-01-03 ENCOUNTER — Encounter: Payer: Self-pay | Admitting: Student

## 2024-01-03 ENCOUNTER — Ambulatory Visit: Payer: Medicare HMO | Admitting: Student

## 2024-01-03 VITALS — BP 130/94 | HR 88 | Ht 69.0 in | Wt 223.1 lb

## 2024-01-03 DIAGNOSIS — Z0000 Encounter for general adult medical examination without abnormal findings: Secondary | ICD-10-CM

## 2024-01-03 DIAGNOSIS — E663 Overweight: Secondary | ICD-10-CM | POA: Diagnosis not present

## 2024-01-03 LAB — POCT GLYCOSYLATED HEMOGLOBIN (HGB A1C): Hemoglobin A1C: 5.2 % (ref 4.0–5.6)

## 2024-01-03 NOTE — Progress Notes (Signed)
    SUBJECTIVE:   Chief compliant/HPI: annual examination  KAESON KLEINERT is a 28 y.o. who presents today for an annual exam.   Weight Loss:  -Wants to discuss weight loss  -Is amenable to medications as well as exercise and dietary changes  Food Log:  - Will eat boiled eggs, oatmeal, bacon, toast for breakfast - Chips for snack - Meatballs/seafood/pizza rolls for lunch  - cookies for snack  - Mix of foods for dinner  - Was taking Hydroxycut for a year - No goal weight - sometimes half and half tea - Will drink black coffee  - Water routinely  Will do jumping jacks and use barbells, goes on walks, exercises 3 days of the week   Filed Weights   01/03/24 1344  Weight: 223 lb 2 oz (101.2 kg)   Reviewed and updated history.    OBJECTIVE:   BP (!) 130/94   Pulse 88   Ht 5' 9 (1.753 m)   Wt 223 lb 2 oz (101.2 kg)   SpO2 99%   BMI 32.95 kg/m   General: Alert and oriented in no apparent distress Heart: Regular rate and rhythm with no murmurs appreciated Lungs: CTA bilaterally, no wheezing Abdomen: Bowel sounds present, no abdominal pain Skin: Warm and dry Extremities: No lower extremity edema   ASSESSMENT/PLAN:   Annual Examination  See AVS for age appropriate recommendations  PHQ score     12/30/2022   11:51 AM 08/14/2021   11:28 AM 11/29/2018   11:38 AM  PHQ9 SCORE ONLY  PHQ-9 Total Score 10 7 0   reviewed and discussed.  Blood pressure reviewed and at goal.      Considered the following items based upon USPSTF recommendations: Patient is not sexually active  HIV testing: not indicated Hepatitis C: not indicated Hepatitis B: not indicated Syphilis if at high risk: not indicated GC/CTnot indicated Lipid panel (nonfasting or fasting) discussed based upon AHA recommendations and not ordered.  Previously performed, consider repeat every 4-6 years.  Reviewed risk factors for latent tuberculosis and not indicated  Elevated BP: X 2 attempts, recheck in  1 month as patient may have been anxious at this appointment.  Otherwise, asymptomatic and healthy 28 year old.  Patient was taking Hydroxycut for a year, will assess CMP and CBC today, instructed to discontinue.  Weight Loss Discussion:  Discussed dietary changes including incorporating 1-3 vegetables into diet 3 days a week, continuing exercise, trying not to drink calories or sugar.  Also instructed to download my fitness pal onto cell phone.  Additionally, sent referral to healthy weight and wellness for further assistance, patient is amenable to this as we do not currently have nutrition.  Laurier Hugger, MD [facility]

## 2024-01-03 NOTE — Patient Instructions (Addendum)
 Eat at least 3 REAL meals and 1-2 snacks per day.  Eat breakfast within one hour of getting up.  Aim for no more than 5 hours between eating.   A REAL meal includes at least some protein, some starch, and vegetables and/or fruit.   (OR: Would you serve this to a guest in your home, and call it a meal?)  TASTE PREFERENCES ARE LEARNED.  This means it will get easier to choose foods you know are good for you if you are exposed to them enough.    Make three lists of vegetables:  Those you like and eat now Vegetables you won't even consider Vegetables you might consider trying if they are prepared a certain way  Continue to eat vegetables you currently eat, but from list #3, choose a vegetable to try at least 3 times a week.  Use small amounts of this vegetable, cut small, combined with foods or seasonings you like.     Food and Nutrition Websites Type in the exact website name below each choice, or the abbreviated name identifying the site.    Dr. Maryann:  Created by pediatrician Veatrice Harpin MD, MPH to help her patients improve their food choices, the site offers recipes and a wealth of information via videos and articles.   https://www.doctoryum.org/  [facility] Extension: Lots of reliable information about food, nutrition, gardening, and physical activity.   https://[place].sodawaters.hu  Lipstickblog.hu: The Academy of Nutrition and Dietetics provides lots of information about nutrition and other aspects of health, including for different segments of the population, i.e., kids, seniors, men, women, and LGBTQ.   comfortablecloset.com.ee  Diabetes.org: The American Diabetes Association offers helpful nutrition, meal planning, and shopping tips for everyone whether you have diabetes or not.   https://diabetes.org/  Heart.org: In addition to heart-related health topics, this site offers excellent suggestions for healthy foods and behaviors for  everyone, not just for those with or at risk of heart disease.   theytell.is  Here are some resources for learning to read nutrition labels appropriately: awarenessadvisor.com.cy fantasticsecrets.nl  I will send you to healthy weight and wellness  Follow up 1-2 months  Download myfitness pal as well

## 2024-01-04 ENCOUNTER — Encounter: Payer: Self-pay | Admitting: Student

## 2024-01-04 LAB — COMPREHENSIVE METABOLIC PANEL
ALT: 18 [IU]/L (ref 0–44)
AST: 23 [IU]/L (ref 0–40)
Albumin: 4.8 g/dL (ref 4.3–5.2)
Alkaline Phosphatase: 71 [IU]/L (ref 44–121)
BUN/Creatinine Ratio: 12 (ref 9–20)
BUN: 12 mg/dL (ref 6–20)
Bilirubin Total: 0.5 mg/dL (ref 0.0–1.2)
CO2: 26 mmol/L (ref 20–29)
Calcium: 9.9 mg/dL (ref 8.7–10.2)
Chloride: 99 mmol/L (ref 96–106)
Creatinine, Ser: 1 mg/dL (ref 0.76–1.27)
Globulin, Total: 2.5 g/dL (ref 1.5–4.5)
Glucose: 81 mg/dL (ref 70–99)
Potassium: 4.4 mmol/L (ref 3.5–5.2)
Sodium: 138 mmol/L (ref 134–144)
Total Protein: 7.3 g/dL (ref 6.0–8.5)
eGFR: 107 mL/min/{1.73_m2} (ref >59)

## 2024-01-04 LAB — CBC WITH DIFFERENTIAL/PLATELET
Basophils Absolute: 0.1 10*3/uL (ref 0.0–0.2)
Basos: 1 %
EOS (ABSOLUTE): 0.3 10*3/uL (ref 0.0–0.4)
Eos: 4 %
Hematocrit: 47.8 % (ref 37.5–51.0)
Hemoglobin: 15.8 g/dL (ref 13.0–17.7)
Immature Grans (Abs): 0 10*3/uL (ref 0.0–0.1)
Immature Granulocytes: 0 %
Lymphocytes Absolute: 2.2 10*3/uL (ref 0.7–3.1)
Lymphs: 27 %
MCH: 29 pg (ref 26.6–33.0)
MCHC: 33.1 g/dL (ref 31.5–35.7)
MCV: 88 fL (ref 79–97)
Monocytes Absolute: 0.6 10*3/uL (ref 0.1–0.9)
Monocytes: 8 %
Neutrophils Absolute: 4.9 10*3/uL (ref 1.4–7.0)
Neutrophils: 60 %
Platelets: 262 10*3/uL (ref 150–450)
RBC: 5.45 x10E6/uL (ref 4.14–5.80)
RDW: 12.8 % (ref 11.6–15.4)
WBC: 8.1 10*3/uL (ref 3.4–10.8)

## 2024-01-18 ENCOUNTER — Ambulatory Visit: Payer: Medicare HMO

## 2024-01-18 VITALS — BP 123/96 | HR 83 | Ht 70.0 in | Wt 225.6 lb

## 2024-01-18 DIAGNOSIS — B353 Tinea pedis: Secondary | ICD-10-CM

## 2024-01-18 DIAGNOSIS — R4589 Other symptoms and signs involving emotional state: Secondary | ICD-10-CM

## 2024-01-18 DIAGNOSIS — R030 Elevated blood-pressure reading, without diagnosis of hypertension: Secondary | ICD-10-CM | POA: Diagnosis not present

## 2024-01-18 MED ORDER — CLOTRIMAZOLE 1 % EX OINT: 1.0000 | TOPICAL_OINTMENT | Freq: Two times a day (BID) | CUTANEOUS | 1 refills | Status: DC

## 2024-01-18 MED ORDER — TERBINAFINE HCL 250 MG PO TABS: 250.0000 mg | ORAL_TABLET | Freq: Every day | ORAL | 0 refills | Status: AC

## 2024-01-18 NOTE — Progress Notes (Signed)
    SUBJECTIVE:   CHIEF COMPLAINT / HPI:   L foot peeling/thickened skin  For 2 weeks Does not hurt No one else with similar symptoms Never had this before Has applied A&D ointment over past couple days    PERTINENT  PMH / PSH: Autism, depressed mood  OBJECTIVE:   BP (!) 123/96   Pulse 83   Ht 5\' 10"  (1.778 m)   Wt 225 lb 9.6 oz (102.3 kg)   SpO2 100%   BMI 32.37 kg/m    General: NAD, pleasant Cardiac: Well-perfused Respiratory: Breathing comfortably on room air Skin: Thickened cracked skin on plantar surface of left foot, see photo Psych: Mood and affect appropriate for situation      ASSESSMENT/PLAN:   Depressed mood PHQ-9 positive for #9.  Asked mother and sister to leave the room while I discussed with patient.  Denies thoughts of hurting himself or that he would be better off dead, unsure that he understood the question when he answered it previously.  Patient states he is okay discussing in front of mother.  Mother states pt does seem depressed at times but she has no concerns about self harm.  She does wonder if he would benefit from treatment.  Advised to schedule appointment with PCP in near future to further discuss.  Tinea pedis of left foot Exam concerning for fungal infection of left foot.  Will treat with oral and topical medications given extent of likely infection. -Terbinafine once daily for 12 weeks, CMP checked on 01/06/2024 and WNL -Future CMP ordered to be done in about 6 weeks while on terbinafine -Clotrimazole ointment twice daily until lesion resolves  Elevated BP without diagnosis of hypertension Diastolic very mildly elevated.  Can continue to monitor at future visits     Dr. Erick Alley, DO [place] [facility]

## 2024-01-18 NOTE — Assessment & Plan Note (Signed)
Diastolic very mildly elevated.  Can continue to monitor at future visits

## 2024-01-18 NOTE — Assessment & Plan Note (Signed)
Exam concerning for fungal infection of left foot.  Will treat with oral and topical medications given extent of likely infection. -Terbinafine once daily for 12 weeks, CMP checked on 01/06/2024 and WNL -Future CMP ordered to be done in about 6 weeks while on terbinafine -Clotrimazole ointment twice daily until lesion resolves

## 2024-01-18 NOTE — Assessment & Plan Note (Addendum)
PHQ-9 positive for #9.  Asked mother and sister to leave the room while I discussed with patient.  Denies thoughts of hurting himself or that he would be better off dead, unsure that he understood the question when he answered it previously.  Patient states he is okay discussing in front of mother.  Mother states pt does seem depressed at times but she has no concerns about self harm.  She does wonder if he would benefit from treatment.  Advised to schedule appointment with PCP in near future to further discuss.

## 2024-01-18 NOTE — Patient Instructions (Signed)
It was great to see you! Thank you for allowing me to participate in your care!   Our plans for today:  -I sent prescriptions for 2 antifungal medications.  1 is an oral medication, terbinafine, take once daily for 12 weeks.  The other is an ointment, apply this twice daily until lesion has resolved. -We will need to monitor liver function while on the terbinafine.  Please return for a lab visit in about 6 weeks. -Schedule appointment with your PCP to discuss mood if desired    Take care and seek immediate care sooner if you develop any concerns.   Dr. Erick Alley, DO [facility]

## 2024-02-08 ENCOUNTER — Ambulatory Visit: Payer: Medicare HMO | Admitting: Student

## 2024-02-09 ENCOUNTER — Ambulatory Visit: Payer: Medicare HMO | Admitting: Student

## 2024-05-23 ENCOUNTER — Ambulatory Visit (INDEPENDENT_AMBULATORY_CARE_PROVIDER_SITE_OTHER): Admitting: Student

## 2024-05-23 VITALS — BP 120/80 | HR 93 | Ht 70.28 in | Wt 230.8 lb

## 2024-05-23 DIAGNOSIS — R030 Elevated blood-pressure reading, without diagnosis of hypertension: Secondary | ICD-10-CM | POA: Diagnosis not present

## 2024-05-23 NOTE — Patient Instructions (Signed)
 It was great seeing you today.  As we discussed, - Form completed for day center - Blood pressure is normal   If you have any questions or concerns, please feel free to call the clinic.   Have a wonderful day,  Dr. Vallorie [facility] [phone]

## 2024-05-23 NOTE — Progress Notes (Signed)
    SUBJECTIVE:   CHIEF COMPLAINT / HPI:   Form completion Plans to attend "[employer] [facility]" Starting in June Will help learn money counting/managing, etc.  Elevated BP In prior visits, elevated x2  01/18/24: 128/91, 123/96 No energy drinks, use of ilicit substances No family hx hypertension Mom wanted re-check today  PERTINENT  PMH / PSH: Autism spectrum disorder  OBJECTIVE:   There were no vitals taken for this visit. ***   ASSESSMENT/PLAN:   No problem-specific Assessment & Plan notes found for this encounter.     Alicha Raspberry, DO [place] [facility]    {    This will disappear when note is signed, click to select method of visit    :1}

## 2024-05-25 NOTE — Assessment & Plan Note (Signed)
 BP wnl today Follow PRN

## 2024-05-30 ENCOUNTER — Ambulatory Visit: Admitting: Student

## 2024-07-10 ENCOUNTER — Ambulatory Visit (INDEPENDENT_AMBULATORY_CARE_PROVIDER_SITE_OTHER)

## 2024-07-10 VITALS — BP 134/88 | HR 81 | Ht 70.0 in | Wt 234.2 lb

## 2024-07-10 DIAGNOSIS — R4589 Other symptoms and signs involving emotional state: Secondary | ICD-10-CM

## 2024-07-10 DIAGNOSIS — F321 Major depressive disorder, single episode, moderate: Secondary | ICD-10-CM

## 2024-07-10 DIAGNOSIS — L03031 Cellulitis of right toe: Secondary | ICD-10-CM | POA: Diagnosis not present

## 2024-07-10 DIAGNOSIS — J302 Other seasonal allergic rhinitis: Secondary | ICD-10-CM

## 2024-07-10 DIAGNOSIS — B353 Tinea pedis: Secondary | ICD-10-CM

## 2024-07-10 MED ORDER — DIPHENHYDRAMINE-ZINC ACETATE 2-0.1 % EX CREA: 1.0000 | TOPICAL_CREAM | Freq: Three times a day (TID) | CUTANEOUS | 0 refills | Status: AC | PRN

## 2024-07-10 MED ORDER — CETIRIZINE HCL 1 MG/ML PO SOLN: 10.0000 mg | Freq: Every day | ORAL | 11 refills | Status: AC

## 2024-07-10 MED ORDER — CLOTRIMAZOLE 1 % EX OINT: 1.0000 | TOPICAL_OINTMENT | Freq: Two times a day (BID) | CUTANEOUS | 1 refills | Status: AC

## 2024-07-10 MED ORDER — CEPHALEXIN 500 MG PO CAPS: 500.0000 mg | ORAL_CAPSULE | Freq: Three times a day (TID) | ORAL | 0 refills | Status: AC

## 2024-07-10 MED ORDER — ESCITALOPRAM OXALATE 10 MG PO TABS: 10.0000 mg | ORAL_TABLET | Freq: Every day | ORAL | 0 refills | Status: DC

## 2024-07-10 NOTE — Assessment & Plan Note (Signed)
 Refilled clotrimazole  topical anti-fungal twice daily, and gave footcare strategies.

## 2024-07-10 NOTE — Progress Notes (Cosign Needed)
    SUBJECTIVE:   CHIEF COMPLAINT / HPI: His mother, [place] was present  Paronychia: Started approximately 10 days ago, reports intermittent bleeding, and tender to palpation.  No history of toenail infection previously, or ingrown toenail  Foot fungus: Patient did 2 months of the oral antifungal, and intermittently used the antifungal cream, but was not consistent.  Has worn the same shoes for over a year, and socks appear to be quite old.  Lesions are nonpainful. shares the bathroom with his sister and uncle, all who use the bathroom barefoot.  Depression/mood: Patient screened positive for question 9, interviewed patient alone without mother.  He denied suicidal ideation both passive or active, no homicidal ideation.  Does report anhedonia, not wanting to go out as well as depressed mood ongoing for the past 3 to 4 months.  He has low energy, and is gained weight, and is not sleeping very well.  Has never been treated for depression in the past.  His mom reports that he does get aggressive about once a day, due to grief from a friend who passed away about a year ago.  He sees a behavioral therapist about once a month, but has never seen a psychiatrist.  Weight: Patient has been trying to lose weight but is struggling with this.  We discussed diet, and his mother had questions about protein shakes.  PERTINENT  PMH / PSH: Reviewed  OBJECTIVE:   BP 134/88   Pulse 81   Ht 5' 10 (1.778 m)   Wt 234 lb 3.2 oz (106.2 kg)   SpO2 100%   BMI 33.60 kg/m    Foot: Please see attached media, paronychia of right toe, developing paronychia left toe.  Scaling hypertrophied area of medial and lateral aspect of left foot. Cardiac: Regular rate and rhythm. Normal S1/S2. No murmurs, rubs, or gallops appreciated. Lungs: Clear bilaterally to ascultation.  Abdomen: Normoactive bowel sounds. No tenderness to deep or light palpation. No rebound or guarding.    Psych: Avoided eye contact, reactive mood,  depressed, soft voice.   ASSESSMENT/PLAN:   Assessment & Plan Tinea pedis of left foot Refilled clotrimazole  topical anti-fungal twice daily, and gave footcare strategies. At high risk for self harm Interviewed patient privately, denied. Paronychia of great toe of right foot Prescribed 10-day course of ceftriaxone 500 mg 3 times daily.  Will follow-up in 2 weeks, discussed the possibility of toenail removal Current moderate episode of major depressive disorder, unspecified whether recurrent (HCC) Started Lexapro  10 mg, instructed patient regarding monitoring for mania and suicidal ideation Seasonal allergies Refilled cetirizine      Fairy Amy, MD [facility]

## 2024-07-10 NOTE — Patient Instructions (Addendum)
 Thank you for visiting the clinic today, it was good to see you! In today's visit we discussed:  Toe nail infection: Take the antibiotic by mouth three times a day with food for 10 days, keep the wound dry and covered.  Foot fungus: Apply the antifungal cream twice a day, wear high quality-moisture wicking socks, and get a new pair of shoes with good air flow  Depression: We are going to start a medication for this, take it once a day at morning or night, depending on your reaction to the medication.  This medication can increase suicidal ideation, or increased aggression/mania, so please monitor for this.  Weight: I have attached a handout for the Mediterranean diet, try to eat as much naturally occurring proteins through beans, lentils, fish, chicken, and protein shakes are okay as well.  Try getting a heart rate monitor, and doing exercises that put your heart rate 130 to 150 bpm about 30 minutes a day  Please follow-up in 2 weeks  For any questions, please call the office at [phone] or send me a message in MyChart. Have a great day!  -Fairy Amy, MD  [facility] Resident, PGY-1

## 2024-08-19 ENCOUNTER — Other Ambulatory Visit: Payer: Self-pay

## 2024-08-19 DIAGNOSIS — F321 Major depressive disorder, single episode, moderate: Secondary | ICD-10-CM

## 2024-08-22 NOTE — Progress Notes (Signed)
 Subjective:   There are no active problems to display for this patient.    Chief Complaint  Patient presents with  . Jaw Pain    Patient complaining of right sided jaw pain since Sunday. Jaw is swollen. No known injuries. Patient has been using Tylenol  and Ibuprofen  for the pain, none today.      History of Present Illness The patient is a 28 year old male who presents for right-sided jaw pain.  He reports experiencing pain on the right side of his jaw, which began either on Saturday or Sunday. The pain intensifies when he chews. He notes swelling and pain to the area just in front of his ear. He denies fever. He states he is unable to completely open his mouth d/t pain. He has not taken any pain medication for this issue. He does not have a fever, sore throat, or dental problems. He also does not experience any ear pain or hearing difficulties.  Parts of patient history reviewed include PMH, problem list, medications, allergies, and social history.  Objective:   Vitals:   08/22/24 1514  BP: 129/81  Pulse: 90  Resp: 18  Temp: 98.4 F (36.9 C)  TempSrc: Tympanic  SpO2: 98%  Weight: 104 kg (230 lb)  Height: 1.778 m (5' 10)    Physical Exam Vitals and nursing note reviewed.  Constitutional:      General: He is not in acute distress.    Appearance: Normal appearance. He is normal weight. He is not ill-appearing or toxic-appearing.  HENT:     Head: Normocephalic and atraumatic.     Jaw: Trismus, swelling (right parotid gland) and pain on movement present.     Salivary Glands: Right salivary gland is diffusely enlarged and tender.     Comments: No purulence from the salivary gland     Right Ear: There is impacted cerumen.     Left Ear: Tympanic membrane and ear canal normal.     Nose: Nose normal.     Mouth/Throat:     Lips: Pink.     Mouth: Mucous membranes are moist.     Dentition: Normal dentition. No dental tenderness, gingival swelling or dental abscesses.      Pharynx: Oropharynx is clear. Uvula midline. Posterior oropharyngeal erythema present. No pharyngeal swelling or oropharyngeal exudate.     Tonsils: No tonsillar exudate.  Eyes:     Extraocular Movements: Extraocular movements intact.     Conjunctiva/sclera: Conjunctivae normal.     Pupils: Pupils are equal, round, and reactive to light.  Cardiovascular:     Rate and Rhythm: Normal rate and regular rhythm.     Pulses: Normal pulses.     Heart sounds: Normal heart sounds.  Pulmonary:     Effort: Pulmonary effort is normal. No respiratory distress.     Breath sounds: Normal breath sounds.  Musculoskeletal:     Cervical back: Normal range of motion and neck supple.  Skin:    General: Skin is warm.     Capillary Refill: Capillary refill takes less than 2 seconds.  Neurological:     Mental Status: He is alert and oriented to person, place, and time. Mental status is at baseline.    Ear Cerumen Removal  Date/Time: 08/22/2024 3:30 PM  Performed by: Sotero Jenkins Pore, NP Authorized by: Sotero Jenkins Pore, NP   Procedure Details:  Impacted cerumen: Yes  Cerumen removal location: right Procedure type: irrigation  Post Procedure Details:  Procedure completion: Following the procedure, the involved  ear(s) were re-examined and the findings are below: Procedure findings: complete impaction removal Patient tolerance of procedure: patient tolerated the procedure without difficulty Comments: Assessment of the tympanic membrane is pearly gray without perforation, retraction, swelling, or debris     Assessment/Plan:   Assessment & Plan  Parotitis  Jaw Pain Permanent impaction   This is a 28 year old male presenting with 4-day history of pain to the right jaw and swelling to the parotid gland.  No associated symptoms, afebrile.  No history of trauma.  Well-appearing on physical exam.  The right tympanic membrane had complete cerumen impaction, refer to procedure note for detail.  No evidence  of acute otitis media or otitis externa.  There is no obvious fluctuant concerning for parotid abscess.  The parotid gland was massaged without expression of purulent fluid.  Patient does have trismus however his airway is patent.  He is speaking in clear sentences without muffled voice or drooling.  He is tolerating p.o. fluids.  There is no exudate, swelling, or erythema to the pharynx.  There is no obvious dental decay, abscess, or gingival swelling.  I have concern for bacterial parotitis.  As patient is afebrile, with patent airway, and not toxic in appearance, feel we can trial outpatient antibiotics.  Starting patient on Augmentin twice a day x 7 days.  Emphasized the importance of following up for reassessment if no improvement with antibiotics in the next 24 to 48 hours. May need US  vs CT of area.  Advised massage and heat to the affected gland, stimulation with using sialogogues.  Advised ED evaluation if develops fever, worsening symptoms, or difficulty breathing.  Patient verbalized understanding.  Follow up with PCP   Patient has been instructed on medications, dosages, side effects, and possible interactions as associated with each diagnosis in my impression and plan above. Patient education (verbal/handout) given on diagnosis, pathophysiology, treatment of diagnosis, side effects of medication use for treatment, restrictions while taking medication, and supportive measures.   Patient was instructed on when to follow up and know that they can follow up here, with their PCP, Urgent Care, ED.  They have been instructed that if symptoms worsen that should return to the clinic, go to the nearest ED, or activate EMS. Red Flags associated with their diagnoses were reviewed and patient was educated on what to do if red.       Patient agreed with plan and voiced understanding.  No barriers to adherence perceived by myself.  Portions of this note may have been dictated using Dragon dictation  software/hardware and may contain grammatical or spelling errors.   Electronically signed by:   Sotero Pore, DNP ENP-C FNP-C [facility]  08/22/2024 3:21 PM

## 2024-08-22 NOTE — Patient Instructions (Signed)
 Start taking antibiotic twice a day for 7 days.  On sour candies such as lemon drops to help with drainage of your salvation gland. Massage and apply heat to the affected area.   Stay well-hydrated usually.  If you develop fever greater than 100.4, difficulty breathing, or worsening of swelling/pain go directly to the emergency department

## 2024-09-29 ENCOUNTER — Other Ambulatory Visit: Payer: Self-pay

## 2024-09-29 DIAGNOSIS — F321 Major depressive disorder, single episode, moderate: Secondary | ICD-10-CM

## 2024-11-04 ENCOUNTER — Other Ambulatory Visit: Payer: Self-pay

## 2024-11-04 DIAGNOSIS — F321 Major depressive disorder, single episode, moderate: Secondary | ICD-10-CM

## 2024-12-23 ENCOUNTER — Other Ambulatory Visit: Payer: Self-pay

## 2024-12-23 DIAGNOSIS — F321 Major depressive disorder, single episode, moderate: Secondary | ICD-10-CM
